# Patient Record
Sex: Female | Born: 1970 | Race: White | Hispanic: No | Marital: Married
Health system: Southern US, Community
[De-identification: ages and names within clinical notes are randomized; demographics above are authoritative.]

## PROBLEM LIST (undated history)

## (undated) DIAGNOSIS — J45909 Unspecified asthma, uncomplicated: Secondary | ICD-10-CM

## (undated) DIAGNOSIS — E282 Polycystic ovarian syndrome: Secondary | ICD-10-CM

## (undated) DIAGNOSIS — E119 Type 2 diabetes mellitus without complications: Secondary | ICD-10-CM

---

## 2018-11-28 ENCOUNTER — Other Ambulatory Visit: Payer: Self-pay

## 2018-11-28 ENCOUNTER — Encounter (HOSPITAL_COMMUNITY): Payer: Self-pay

## 2018-11-28 ENCOUNTER — Emergency Department (HOSPITAL_COMMUNITY): Payer: PRIVATE HEALTH INSURANCE

## 2018-11-28 ENCOUNTER — Emergency Department (HOSPITAL_COMMUNITY)
Admission: EM | Admit: 2018-11-28 | Discharge: 2018-11-28 | Disposition: A | Discharge status: Home / Self Care | Payer: PRIVATE HEALTH INSURANCE | Attending: Emergency Medicine | Admitting: Emergency Medicine

## 2018-11-28 DIAGNOSIS — Y92009 Unspecified place in unspecified non-institutional (private) residence as the place of occurrence of the external cause: Secondary | ICD-10-CM | POA: Insufficient documentation

## 2018-11-28 DIAGNOSIS — X500XXA Overexertion from strenuous movement or load, initial encounter: Secondary | ICD-10-CM | POA: Diagnosis not present

## 2018-11-28 DIAGNOSIS — Y999 Unspecified external cause status: Secondary | ICD-10-CM | POA: Diagnosis not present

## 2018-11-28 DIAGNOSIS — S46911A Strain of unspecified muscle, fascia and tendon at shoulder and upper arm level, right arm, initial encounter: Secondary | ICD-10-CM | POA: Insufficient documentation

## 2018-11-28 DIAGNOSIS — J45909 Unspecified asthma, uncomplicated: Secondary | ICD-10-CM | POA: Diagnosis not present

## 2018-11-28 DIAGNOSIS — E119 Type 2 diabetes mellitus without complications: Secondary | ICD-10-CM | POA: Diagnosis not present

## 2018-11-28 DIAGNOSIS — Y93E6 Activity, residential relocation: Secondary | ICD-10-CM | POA: Insufficient documentation

## 2018-11-28 DIAGNOSIS — S4991XA Unspecified injury of right shoulder and upper arm, initial encounter: Secondary | ICD-10-CM | POA: Diagnosis present

## 2018-11-28 HISTORY — DX: Unspecified asthma, uncomplicated: J45.909

## 2018-11-28 HISTORY — DX: Polycystic ovarian syndrome: E28.2

## 2018-11-28 HISTORY — DX: Type 2 diabetes mellitus without complications: E11.9

## 2018-11-28 MED ORDER — METHOCARBAMOL 500 MG PO TABS
500.0000 mg | ORAL_TABLET | Freq: Once | ORAL | Status: AC
  Administered 2018-11-28: 500 mg via ORAL
  Filled 2018-11-28: qty 1

## 2018-11-28 MED ORDER — LIDOCAINE 5 % EX PTCH
1.0000 | MEDICATED_PATCH | CUTANEOUS | Status: DC
  Administered 2018-11-28: 1 via TRANSDERMAL
  Filled 2018-11-28: qty 1

## 2018-11-28 MED ORDER — OXYCODONE-ACETAMINOPHEN 5-325 MG PO TABS
1.0000 | ORAL_TABLET | Freq: Once | ORAL | Status: AC
  Administered 2018-11-28: 1 via ORAL
  Filled 2018-11-28: qty 1

## 2018-11-28 MED ORDER — IBUPROFEN 600 MG PO TABS: 600.0000 mg | ORAL_TABLET | Freq: Four times a day (QID) | ORAL | 0 refills | Status: AC | PRN

## 2018-11-28 MED ORDER — IBUPROFEN 800 MG PO TABS
800.0000 mg | ORAL_TABLET | Freq: Once | ORAL | Status: AC
  Administered 2018-11-28: 800 mg via ORAL
  Filled 2018-11-28: qty 1

## 2018-11-28 MED ORDER — METHOCARBAMOL 500 MG PO TABS: 500.0000 mg | ORAL_TABLET | Freq: Three times a day (TID) | ORAL | 0 refills | Status: AC | PRN

## 2018-11-28 MED ORDER — LIDOCAINE 5 % EX PTCH: 1.0000 | MEDICATED_PATCH | CUTANEOUS | 0 refills | Status: AC

## 2018-11-28 NOTE — ED Triage Notes (Signed)
Pt arrived with complaints of right shoulder/arm pain. Pt is here helping her mom move and is unsure if she hurt it moving boxes or because she is sleeping on an air mattress.

## 2018-11-28 NOTE — Discharge Instructions (Signed)
Alternate ice and heat to areas of injury 3-4 times per day to limit inflammation and spasm.  Avoid strenuous activity and heavy lifting.  Continue with frequent shoulder stretching. We recommend consistent use of ibuprofen in addition to Robaxin for muscle spasms.  Do not drive or drink alcohol after taking Robaxin as it may make you drowsy and impair your judgment.  We recommend follow-up with a primary care doctor to ensure resolution of symptoms.  Return to the ED for any new or concerning symptoms.

## 2018-11-28 NOTE — ED Notes (Signed)
Patient transported to X-ray 

## 2018-11-28 NOTE — ED Provider Notes (Signed)
[facility]-EMERGENCY DEPT Provider Note   CSN: [id number] Arrival date & time: 11/28/18  0410    History   Chief Complaint Chief Complaint  Patient presents with  . Shoulder Pain    HPI Tonya Esparza is a 48 y.o. female.     48 year old female with a history of asthma, hypertension, diabetes presents to the ED for complaints of right shoulder pain.  She awoke yesterday morning with pain in her right shoulder.  This is aggravated with movement.  She believes that she may have strained her shoulder when helping her mother move out of her home.  She has been moving a lot of boxes lately and sleeping on an air mattress.  Patient last took ibuprofen for pain at 1800 yesterday.  She took some Tylenol before bed.  The patient has also been applying icy hot patches to the area.  She tried to stretch it under hot water while in the shower.  No associated numbness, paresthesias, extremity weakness.  Denies prior history of right shoulder pain/injury.  She is traveling home to [place] later today.  The history is provided by the patient. No language interpreter was used.    Past Medical History:  Diagnosis Date  . Asthma   . Diabetes mellitus without complication (HCC)   . PCOS (polycystic ovarian syndrome)     There are no active problems to display for this patient.   Past Surgical History:  Procedure Laterality Date  . CESAREAN SECTION       OB History   No obstetric history on file.      Home Medications    Prior to Admission medications   Medication Sig Start Date End Date Taking? Authorizing Provider  ibuprofen (ADVIL) 600 MG tablet Take 1 tablet (600 mg total) by mouth every 6 (six) hours as needed. 11/28/18   Antony MaduraHumes, Skyelyn Scruggs, PA-C  lidocaine (LIDODERM) 5 % Place 1 patch onto the skin daily. Remove & Discard patch within 12 hours or as directed by MD 11/28/18   Antony MaduraHumes, Odester Nilson, PA-C  methocarbamol (ROBAXIN) 500 MG tablet Take 1 tablet (500 mg total)  by mouth every 8 (eight) hours as needed for muscle spasms. 11/28/18   Antony MaduraHumes, Zacariah Belue, PA-C    Family History No family history on file.  Social History Social History   Tobacco Use  . Smoking status: Not on file  Substance Use Topics  . Alcohol use: Not on file  . Drug use: Not on file     Allergies   Patient has no known allergies.   Review of Systems Review of Systems Ten systems reviewed and are negative for acute change, except as noted in the HPI.    Physical Exam Updated Vital Signs BP (!) 152/88   Pulse 81   Temp 98.9 F (37.2 C) (Oral)   Resp (!) 21   Ht 5\' 3"  (1.6 m)   LMP 11/07/2018   SpO2 97%   Physical Exam Vitals signs and nursing note reviewed.  Constitutional:      General: She is not in acute distress.    Appearance: She is well-developed. She is not diaphoretic.     Comments: Nontoxic-appearing and in no acute distress.  HENT:     Head: Normocephalic and atraumatic.  Eyes:     General: No scleral icterus.    Conjunctiva/sclera: Conjunctivae normal.  Neck:     Musculoskeletal: Normal range of motion.  Cardiovascular:     Rate and Rhythm: Normal rate and regular  rhythm.     Pulses: Normal pulses.     Comments: Distal radial pulse 2+ in the right upper extremity. Pulmonary:     Effort: Pulmonary effort is normal. No respiratory distress.     Comments: Respirations even and unlabored Musculoskeletal: Normal range of motion.     Comments: Tenderness to palpation of the anterior right shoulder at the [facility].  No bony deformity or crepitus.  No effusion.  Her range of motion with shoulder abduction and abduction is fairly preserved.  She does have pain beyond 120 degrees abduction.  Skin:    General: Skin is warm and dry.     Coloration: Skin is not pale.     Findings: No erythema or rash.  Neurological:     Mental Status: She is alert and oriented to person, place, and time.     Coordination: Coordination normal.  Psychiatric:         Behavior: Behavior normal.      ED Treatments / Results  Labs (all labs ordered are listed, but only abnormal results are displayed) Labs Reviewed - No data to display  EKG None  Radiology Dg Shoulder Right  Result Date: 11/28/2018 CLINICAL DATA:  Right shoulder and arm pain.  Initial encounter. EXAM: RIGHT SHOULDER - 2+ VIEW COMPARISON:  None. FINDINGS: There is no evidence of fracture or dislocation. There is no evidence of arthropathy or other focal bone abnormality. Soft tissues are unremarkable. IMPRESSION: Negative right shoulder radiographs. Electronically Signed   By: Marin Robertshristopher  Mattern M.D.   On: 11/28/2018 06:11    Procedures Procedures (including critical care time)  Medications Ordered in ED Medications  lidocaine (LIDODERM) 5 % 1 patch (1 patch Transdermal Patch Applied 11/28/18 0510)  oxyCODONE-acetaminophen (PERCOCET/ROXICET) 5-325 MG per tablet 1 tablet (1 tablet Oral Given 11/28/18 0510)  methocarbamol (ROBAXIN) tablet 500 mg (500 mg Oral Given 11/28/18 0510)  ibuprofen (ADVIL) tablet 800 mg (800 mg Oral Given 11/28/18 0510)     6:24 AM Patient with improved pain and full ROM of R shoulder. Pain significantly improved.    Initial Impression / Assessment and Plan / ED Course  I have reviewed the triage vital signs and the nursing notes.  Pertinent labs & imaging results that were available during my care of the patient were reviewed by me and considered in my medical decision making (see chart for details).        Patient presents to the emergency department for evaluation of R shoulder pain. Patient neurovascularly intact on exam. Imaging negative for fracture, dislocation, bony deformity. No swelling, erythema, heat to touch to the affected area; no concern for septic joint. Compartments in the affected extremity are soft. Plan for supportive management including RICE and NSAIDs; primary care follow up as needed. Return precautions discussed and provided.  Patient discharged in stable condition with no unaddressed concerns.   Final Clinical Impressions(s) / ED Diagnoses   Final diagnoses:  Strain of right shoulder, initial encounter    ED Discharge Orders         Ordered    ibuprofen (ADVIL) 600 MG tablet  Every 6 hours PRN     11/28/18 0617    methocarbamol (ROBAXIN) 500 MG tablet  Every 8 hours PRN     11/28/18 0617    lidocaine (LIDODERM) 5 %  Every 24 hours     11/28/18 0617           Antony MaduraHumes, Aamya Orellana, PA-C 11/28/18 [id number]    Cardama, Burley SaverPedro  Johnsie Cancel, MD 11/28/18 [phone]

## 2020-03-28 IMAGING — CR RIGHT SHOULDER - 2+ VIEW
4 series · 4 of 4 positions shown · non-contrast
Comparison: None.

CLINICAL DATA: Right shoulder and arm pain.  Initial encounter.

EXAM:
RIGHT SHOULDER - 2+ VIEW

[w shoulder external right (1 of 2)]
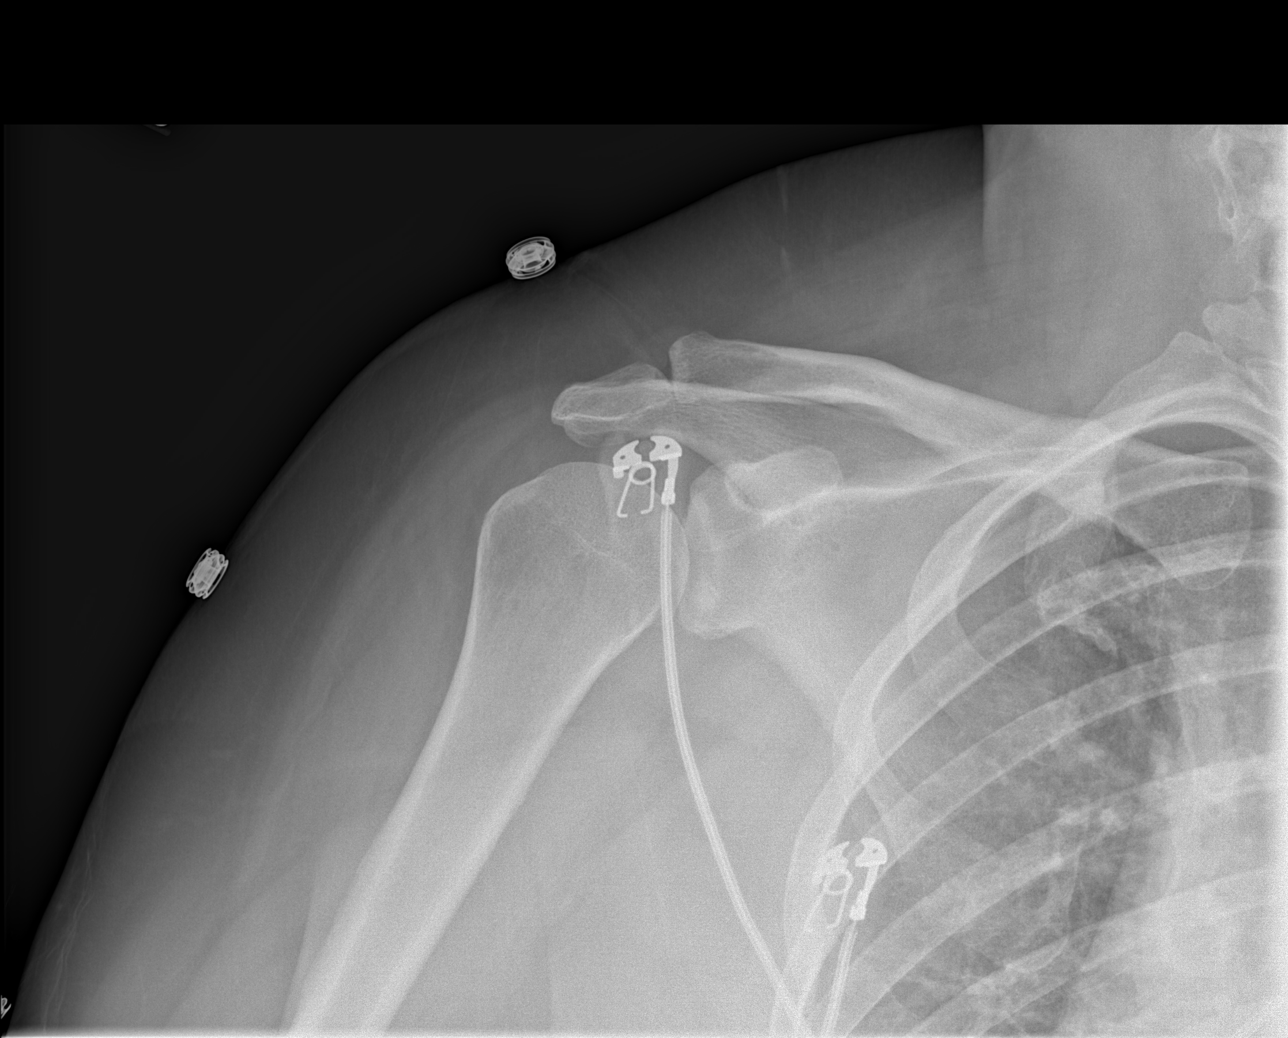

[w shoulder external right (2 of 2)]
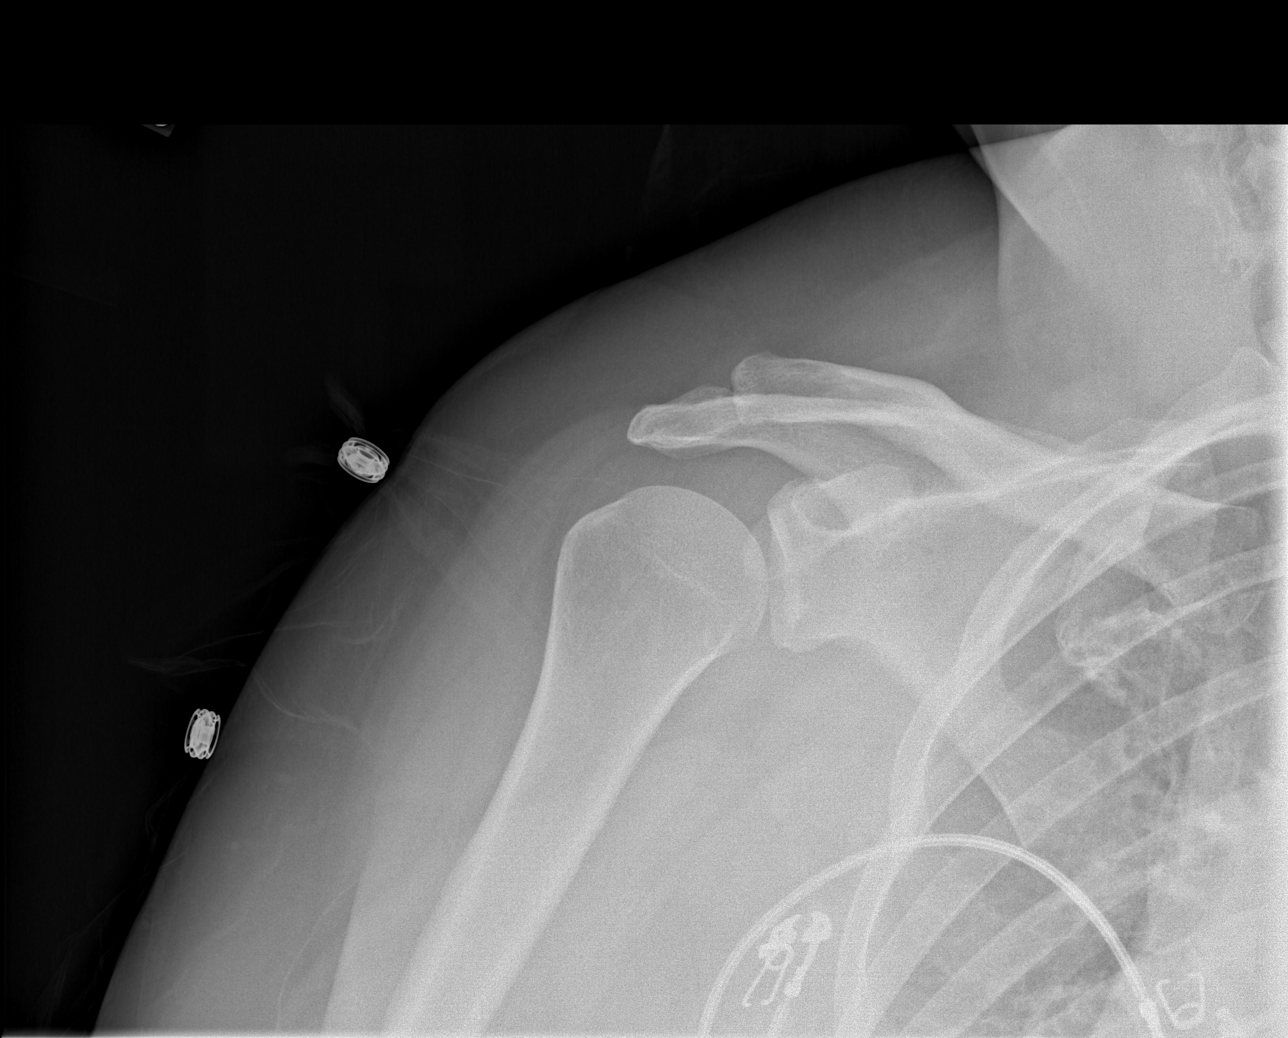

[w shoulder y-view right]
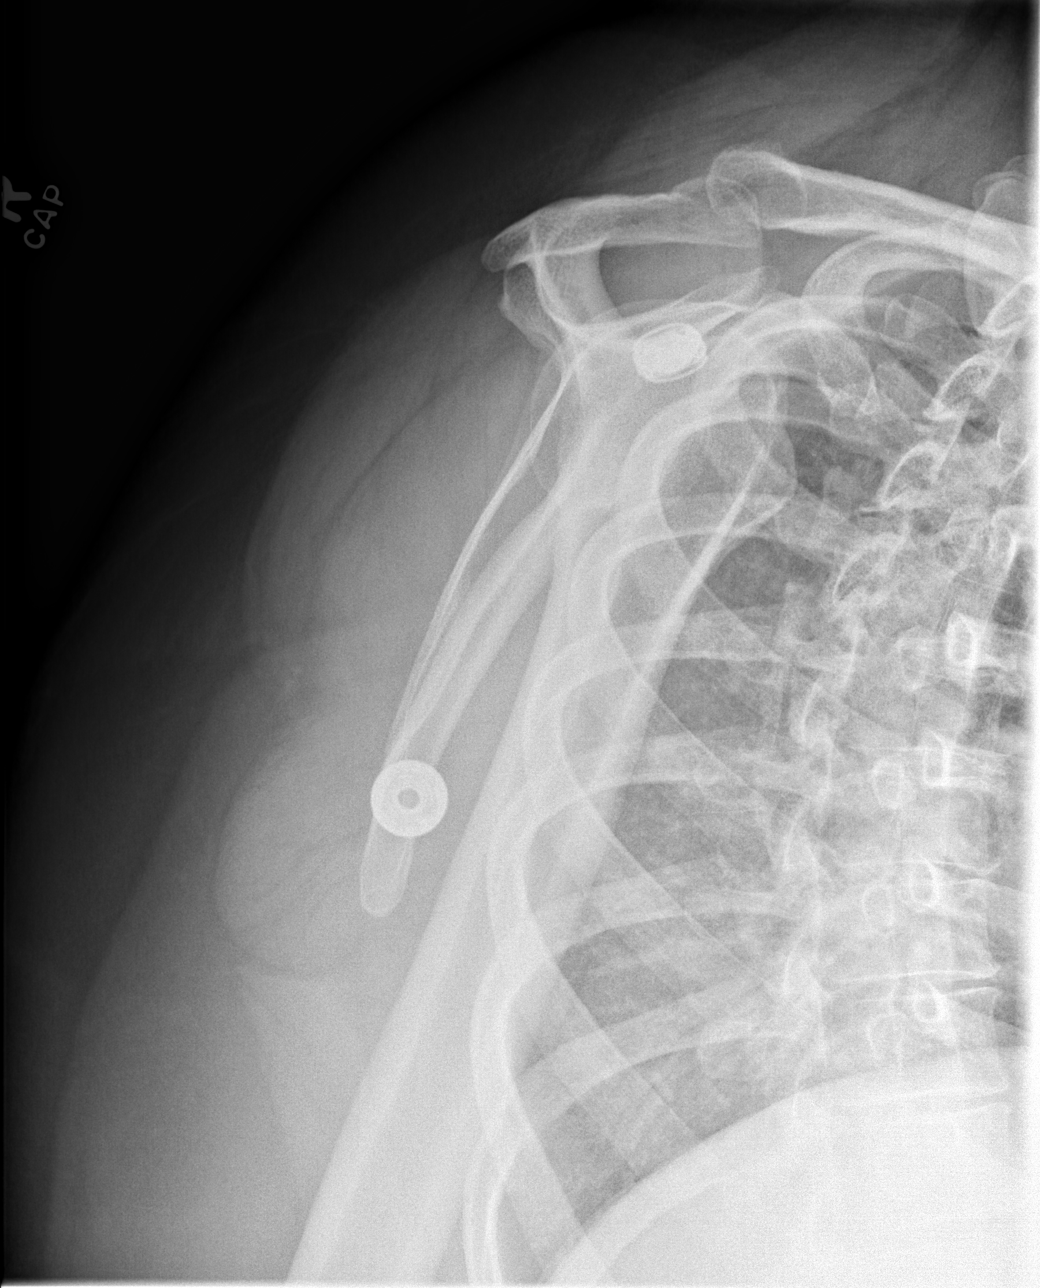

[x shoulder axillary right]
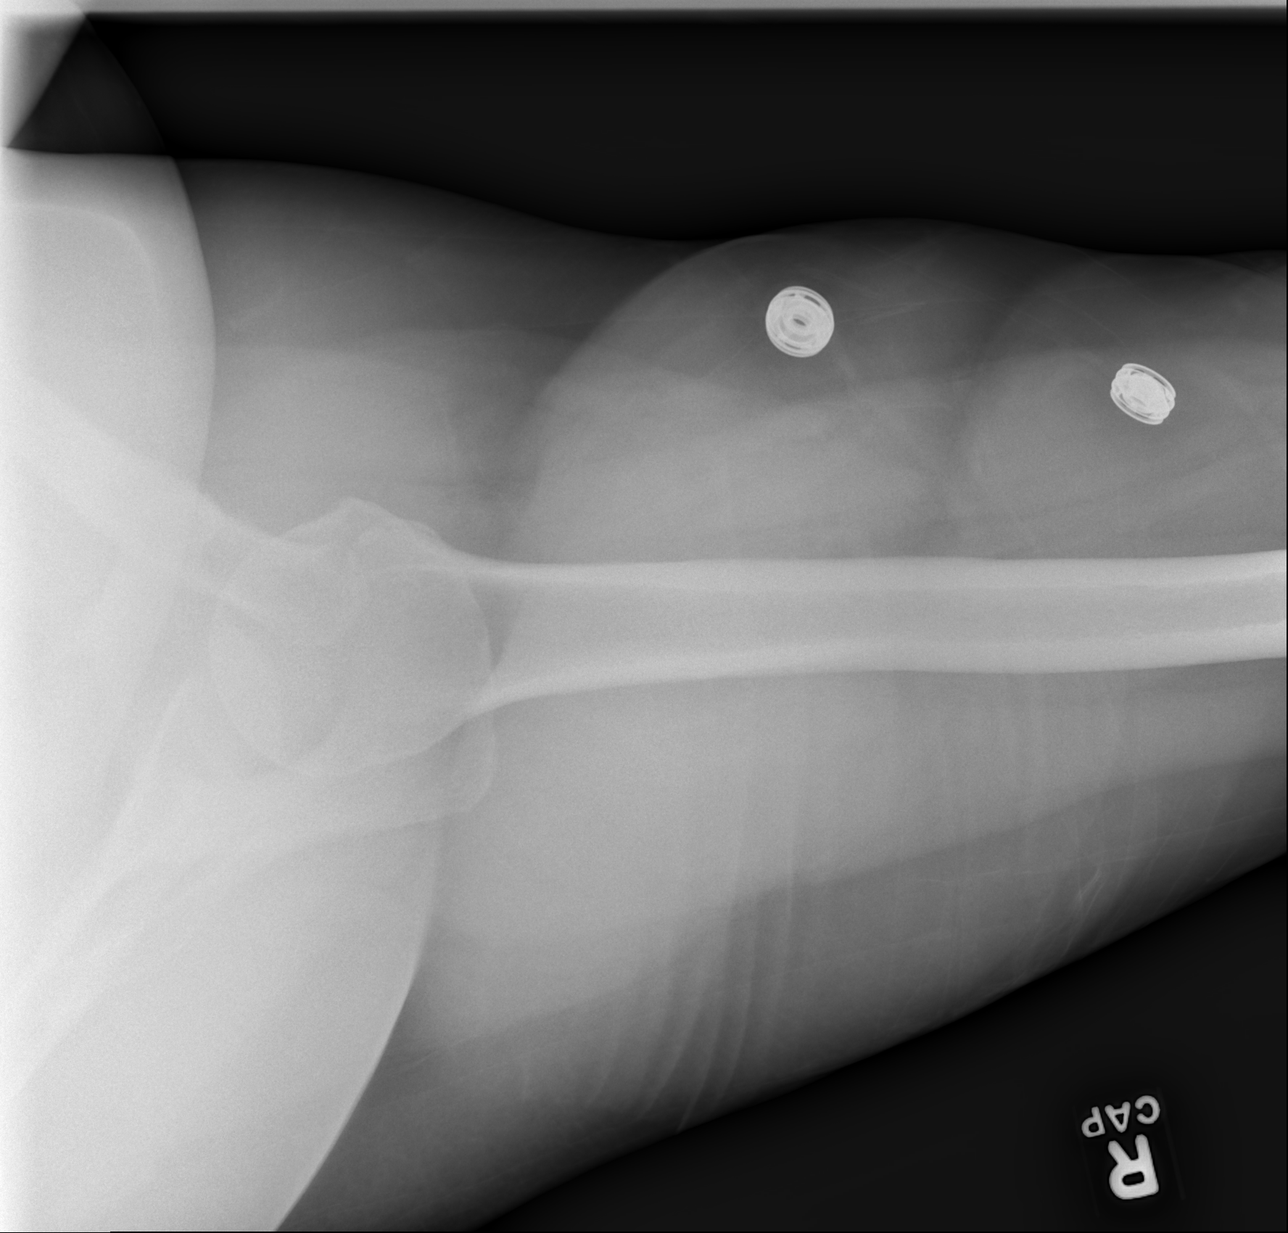

[4 of 4 positions shown; findings below may reference images not displayed]

FINDINGS: There is no evidence of fracture or dislocation. There is no
evidence of arthropathy or other focal bone abnormality. Soft
tissues are unremarkable.
IMPRESSION: Negative right shoulder radiographs.
